# Patient Record
Sex: Female | Born: 2000 | Race: Black or African American | Hispanic: No | Marital: Single | Smoking: Never smoker
Health system: Southern US, Community
[De-identification: ages and names within clinical notes are randomized; demographics above are authoritative.]

## PROBLEM LIST (undated history)

## (undated) ENCOUNTER — Inpatient Hospital Stay (HOSPITAL_COMMUNITY): Payer: Self-pay

## (undated) DIAGNOSIS — Z789 Other specified health status: Secondary | ICD-10-CM

---
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
## 2022-09-26 ENCOUNTER — Ambulatory Visit: Admission: EM | Admit: 2022-09-26 | Discharge: 2022-09-26 | Discharge status: Absent without leave | Payer: Self-pay

## 2022-09-27 ENCOUNTER — Telehealth: Payer: Self-pay

## 2022-09-27 NOTE — Telephone Encounter (Signed)
TCT pt to f/u from recent visit. HIPAA compliant vm left for return call.

## 2023-01-10 ENCOUNTER — Other Ambulatory Visit (HOSPITAL_COMMUNITY): Payer: Self-pay

## 2023-01-10 MED ORDER — CEPHALEXIN 500 MG PO CAPS
500.0000 mg | ORAL_CAPSULE | Freq: Two times a day (BID) | ORAL | 0 refills | Status: DC
  Filled 2023-01-10: qty 10, 5d supply, fill #0

## 2023-01-13 ENCOUNTER — Other Ambulatory Visit (HOSPITAL_BASED_OUTPATIENT_CLINIC_OR_DEPARTMENT_OTHER): Payer: Self-pay

## 2023-01-13 ENCOUNTER — Other Ambulatory Visit: Payer: Self-pay

## 2023-01-13 ENCOUNTER — Encounter (HOSPITAL_BASED_OUTPATIENT_CLINIC_OR_DEPARTMENT_OTHER): Payer: Self-pay | Admitting: Emergency Medicine

## 2023-01-13 ENCOUNTER — Emergency Department (HOSPITAL_BASED_OUTPATIENT_CLINIC_OR_DEPARTMENT_OTHER)
Admission: EM | Admit: 2023-01-13 | Discharge: 2023-01-13 | Disposition: A | Discharge status: Home / Self Care | Payer: Self-pay | Attending: Emergency Medicine | Admitting: Emergency Medicine

## 2023-01-13 ENCOUNTER — Emergency Department (HOSPITAL_BASED_OUTPATIENT_CLINIC_OR_DEPARTMENT_OTHER): Payer: Self-pay

## 2023-01-13 DIAGNOSIS — N3000 Acute cystitis without hematuria: Secondary | ICD-10-CM

## 2023-01-13 DIAGNOSIS — Z3490 Encounter for supervision of normal pregnancy, unspecified, unspecified trimester: Secondary | ICD-10-CM

## 2023-01-13 DIAGNOSIS — O0001 Abdominal pregnancy with intrauterine pregnancy: Secondary | ICD-10-CM | POA: Insufficient documentation

## 2023-01-13 DIAGNOSIS — B9689 Other specified bacterial agents as the cause of diseases classified elsewhere: Secondary | ICD-10-CM | POA: Insufficient documentation

## 2023-01-13 DIAGNOSIS — O23591 Infection of other part of genital tract in pregnancy, first trimester: Secondary | ICD-10-CM | POA: Insufficient documentation

## 2023-01-13 DIAGNOSIS — O2311 Infections of bladder in pregnancy, first trimester: Secondary | ICD-10-CM | POA: Insufficient documentation

## 2023-01-13 DIAGNOSIS — Z3A01 Less than 8 weeks gestation of pregnancy: Secondary | ICD-10-CM | POA: Insufficient documentation

## 2023-01-13 LAB — BASIC METABOLIC PANEL
Anion gap: 12 (ref 5–15)
BUN: 7 mg/dL (ref 6–20)
CO2: 21 mmol/L — ABNORMAL LOW (ref 22–32)
Calcium: 9.6 mg/dL (ref 8.9–10.3)
Chloride: 101 mmol/L (ref 98–111)
Creatinine, Ser: 0.81 mg/dL (ref 0.44–1.00)
GFR, Estimated: >60 mL/min (ref >60)
Glucose, Bld: 89 mg/dL (ref 70–99)
Potassium: 3.9 mmol/L (ref 3.5–5.1)
Sodium: 134 mmol/L — ABNORMAL LOW (ref 135–145)

## 2023-01-13 LAB — CBC WITH DIFFERENTIAL/PLATELET
Abs Immature Granulocytes: 0.02 10*3/uL (ref 0.00–0.07)
Basophils Absolute: 0 10*3/uL (ref 0.0–0.1)
Basophils Relative: 1 %
Eosinophils Absolute: 0.1 10*3/uL (ref 0.0–0.5)
Eosinophils Relative: 1 %
HCT: 40.1 % (ref 36.0–46.0)
Hemoglobin: 13.6 g/dL (ref 12.0–15.0)
Immature Granulocytes: 0 %
Lymphocytes Relative: 19 %
Lymphs Abs: 1.4 10*3/uL (ref 0.7–4.0)
MCH: 26.7 pg (ref 26.0–34.0)
MCHC: 33.9 g/dL (ref 30.0–36.0)
MCV: 78.6 fL — ABNORMAL LOW (ref 80.0–100.0)
Monocytes Absolute: 0.7 10*3/uL (ref 0.1–1.0)
Monocytes Relative: 9 %
Neutro Abs: 5.3 10*3/uL (ref 1.7–7.7)
Neutrophils Relative %: 70 %
Platelets: 193 10*3/uL (ref 150–400)
RBC: 5.1 MIL/uL (ref 3.87–5.11)
RDW: 13.5 % (ref 11.5–15.5)
WBC: 7.5 10*3/uL (ref 4.0–10.5)
nRBC: 0 % (ref 0.0–0.2)

## 2023-01-13 LAB — URINALYSIS, ROUTINE W REFLEX MICROSCOPIC
Bilirubin Urine: NEGATIVE
Glucose, UA: NEGATIVE mg/dL
Ketones, ur: 40 mg/dL — AB
Nitrite: NEGATIVE
Protein, ur: 30 mg/dL — AB
RBC / HPF: >50 RBC/hpf (ref 0–5)
Specific Gravity, Urine: 1.029 (ref 1.005–1.030)
pH: 6 (ref 5.0–8.0)

## 2023-01-13 LAB — WET PREP, GENITAL
Sperm: NONE SEEN
Trich, Wet Prep: NONE SEEN
WBC, Wet Prep HPF POC: >=10 — AB (ref <10)
Yeast Wet Prep HPF POC: NONE SEEN

## 2023-01-13 LAB — HIV ANTIBODY (ROUTINE TESTING W REFLEX): HIV Screen 4th Generation wRfx: NONREACTIVE

## 2023-01-13 LAB — HCG, QUANTITATIVE, PREGNANCY: hCG, Beta Chain, Quant, S: 55399 m[IU]/mL — ABNORMAL HIGH (ref <5)

## 2023-01-13 LAB — PREGNANCY, URINE: Preg Test, Ur: POSITIVE — AB

## 2023-01-13 MED ORDER — METRONIDAZOLE 500 MG PO TABS
500.0000 mg | ORAL_TABLET | Freq: Two times a day (BID) | ORAL | 0 refills | Status: DC
  Filled 2023-01-13: qty 14, 7d supply, fill #0

## 2023-01-13 MED ORDER — NITROFURANTOIN MONOHYD MACRO 100 MG PO CAPS
100.0000 mg | ORAL_CAPSULE | Freq: Two times a day (BID) | ORAL | 0 refills | Status: DC
  Filled 2023-01-13: qty 10, 5d supply, fill #0

## 2023-01-13 MED ORDER — SODIUM CHLORIDE 0.9 % IV SOLN: INTRAVENOUS | Status: DC | PRN

## 2023-01-13 MED ORDER — PRENATAL VITAMIN 27-0.8 MG PO TABS
1.0000 | ORAL_TABLET | Freq: Every day | ORAL | 0 refills | Status: AC
  Filled 2023-01-13: qty 100, 100d supply, fill #0

## 2023-01-13 MED ORDER — SODIUM CHLORIDE 0.9 % IV BOLUS
1000.0000 mL | Freq: Once | INTRAVENOUS | Status: AC
  Administered 2023-01-13: 1000 mL via INTRAVENOUS

## 2023-01-13 MED ORDER — SODIUM CHLORIDE 0.9 % IV SOLN
2.0000 g | Freq: Once | INTRAVENOUS | Status: AC
  Administered 2023-01-13: 2 g via INTRAVENOUS
  Filled 2023-01-13: qty 20

## 2023-01-13 MED ORDER — ACETAMINOPHEN 500 MG PO TABS
1000.0000 mg | ORAL_TABLET | Freq: Once | ORAL | Status: AC
  Administered 2023-01-13: 1000 mg via ORAL
  Filled 2023-01-13: qty 2

## 2023-01-13 MED ORDER — KETOROLAC TROMETHAMINE 30 MG/ML IJ SOLN
30.0000 mg | Freq: Once | INTRAMUSCULAR | Status: DC
  Filled 2023-01-13: qty 1

## 2023-01-13 NOTE — ED Triage Notes (Signed)
Pt arrived POV, caxo4, ambulatory NAD. Pt reports she was recently tx by telehealth for UTI with Keflex which will be completed tomorrow. Pt c/o lower back pain, painful urination and reports mild relief since being on abx. Pt also states she took a home pregnancy test that was positive, however has not been confirmed further. LMP 5/8. [id number].

## 2023-01-13 NOTE — ED Provider Notes (Addendum)
[place] EMERGENCY DEPARTMENT AT [facility] Provider Note   CSN: [id number] Arrival date & time: 01/13/23  7829     History  Chief Complaint  Patient presents with   Dysuria    Hannah Griffin is a 22 y.o. female.  Pt is a 22 yo female with no significant pmhx.  Pt has had urinary sx for the past week.  She started taking keflex 500 mg bid on Tuesday, 6/11.  She said she is still having fevers and is still having dysuria.  She also said that she thinks she is pregnant.  LMP was on 5/8.  She had a + home pregnancy last night.  She does not want her BF to know.  She has had some spotting.  She does have some lower abd pain.       Home Medications Prior to Admission medications   Medication Sig Start Date End Date Taking? Authorizing Provider  metroNIDAZOLE (FLAGYL) 500 MG tablet Take 1 tablet (500 mg total) by mouth 2 (two) times daily. 01/13/23  Yes Jacalyn Lefevre, MD  nitrofurantoin, macrocrystal-monohydrate, (MACROBID) 100 MG capsule Take 1 capsule (100 mg total) by mouth 2 (two) times daily. 01/13/23  Yes Jacalyn Lefevre, MD  Prenatal Vit-Fe Fumarate-FA (PRENATAL VITAMIN) 27-0.8 MG TABS Take 1 tablet by mouth daily. 01/13/23  Yes Jacalyn Lefevre, MD  cephALEXin (KEFLEX) 500 MG capsule Take 1 capsule (500 mg total) by mouth every 12 (twelve) hours. 01/10/23     cyclobenzaprine (FLEXERIL) 10 MG tablet Take 0.5-1 tablets (5-10 mg total) by mouth 2 (two) times daily as needed. 05/26/17   Marlon Pel, PA-C  naproxen (NAPROSYN) 500 MG tablet Take 1 tablet (500 mg total) by mouth 2 (two) times daily. 05/26/17   Marlon Pel, PA-C      Allergies    Patient has no known allergies.    Review of Systems   Review of Systems  Gastrointestinal:  Positive for abdominal pain.  Genitourinary:  Positive for dysuria.  All other systems reviewed and are negative.   Physical Exam Updated Vital Signs BP (!) 118/94   Pulse 95   Temp 99.6 F (37.6 C) (Oral)   Resp 20   Ht  5\' 1"  (1.549 m)   Wt 63 kg   LMP 12/07/2022 (Exact Date)   SpO2 100%   BMI 26.26 kg/m  Physical Exam Vitals and nursing note reviewed. Exam conducted with a chaperone present.  Constitutional:      Appearance: Normal appearance.  HENT:     Head: Normocephalic and atraumatic.     Right Ear: External ear normal.     Left Ear: External ear normal.     Nose: Nose normal.     Mouth/Throat:     Mouth: Mucous membranes are dry.     Pharynx: Oropharynx is clear.  Eyes:     Extraocular Movements: Extraocular movements intact.     Conjunctiva/sclera: Conjunctivae normal.     Pupils: Pupils are equal, round, and reactive to light.  Cardiovascular:     Rate and Rhythm: Normal rate and regular rhythm.     Pulses: Normal pulses.     Heart sounds: Normal heart sounds.  Pulmonary:     Effort: Pulmonary effort is normal.     Breath sounds: Normal breath sounds.  Abdominal:     General: Abdomen is flat. Bowel sounds are normal.     Palpations: Abdomen is soft.     Tenderness: There is abdominal tenderness in the suprapubic area.  Genitourinary:    Exam position: Lithotomy position.     Vagina: Vaginal discharge present.     Cervix: Normal.     Uterus: Normal.      Adnexa:        Right: Tenderness present.        Left: Tenderness present.   Musculoskeletal:        General: Normal range of motion.     Cervical back: Normal range of motion and neck supple.  Skin:    General: Skin is warm.     Capillary Refill: Capillary refill takes less than 2 seconds.  Neurological:     General: No focal deficit present.     Mental Status: She is alert and oriented to person, place, and time.  Psychiatric:        Mood and Affect: Mood normal.        Behavior: Behavior normal.     ED Results / Procedures / Treatments   Labs (all labs ordered are listed, but only abnormal results are displayed) Labs Reviewed  WET PREP, GENITAL - Abnormal; Notable for the following components:      Result Value    Clue Cells Wet Prep HPF POC PRESENT (*)    WBC, Wet Prep HPF POC >=10 (*)    All other components within normal limits  URINALYSIS, ROUTINE W REFLEX MICROSCOPIC - Abnormal; Notable for the following components:   APPearance HAZY (*)    Hgb urine dipstick MODERATE (*)    Ketones, ur 40 (*)    Protein, ur 30 (*)    Leukocytes,Ua MODERATE (*)    Bacteria, UA RARE (*)    All other components within normal limits  PREGNANCY, URINE - Abnormal; Notable for the following components:   Preg Test, Ur POSITIVE (*)    All other components within normal limits  BASIC METABOLIC PANEL - Abnormal; Notable for the following components:   Sodium 134 (*)    CO2 21 (*)    All other components within normal limits  CBC WITH DIFFERENTIAL/PLATELET - Abnormal; Notable for the following components:   MCV 78.6 (*)    All other components within normal limits  HCG, QUANTITATIVE, PREGNANCY - Abnormal; Notable for the following components:   hCG, Beta Chain, Quant, S 55,399 (*)    All other components within normal limits  URINE CULTURE  HIV ANTIBODY (ROUTINE TESTING W REFLEX)  TYPE AND SCREEN  GC/CHLAMYDIA PROBE AMP ([employer]) NOT AT [facility]    EKG None  Radiology US RENAL  Result Date: 01/13/2023 CLINICAL DATA:  22 year old female with pain in the 1st trimester of pregnancy. Quantitative beta HCG 55,399. Estimated gestational age by LMP 5 weeks and 2 days. EXAM: RENAL / URINARY TRACT ULTRASOUND COMPLETE COMPARISON:  Ob ultrasound today reported separately. FINDINGS: Right Kidney: Renal measurements: 10.8 x 4.5 x 5.1 cm = volume: 130 mL. Preserved corticomedullary differentiation (series 2 cine images), but the right renal cortex appears echogenic when compared to the liver parenchyma. There is no right hydronephrosis or renal mass identified. Left Kidney: Renal measurements: 10.0 x 4.4 x 4.5 cm = volume: 104 mL. No left hydronephrosis or renal mass. Similar echogenicity of the left renal cortex.  Preserved corticomedullary differentiation. Bladder: Appears normal for degree of bladder distention. Other: None. IMPRESSION: 1. No hydronephrosis or acute renal finding, but increased renal cortical echogenicity raising the possibility of chronic medical renal disease. Query renal insufficiency. 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 2. Unremarkable urinary bladder. Electronically Signed  By: Odessa Fleming M.D.   On: 01/13/2023 11:24   US OB LESS THAN 14 WEEKS WITH OB TRANSVAGINAL  Result Date: 01/13/2023 CLINICAL DATA:  22 year old female with pain in the 1st trimester of pregnancy. Quantitative beta HCG 55,399. Estimated gestational age by LMP 5 weeks and 2 days. EXAM: OBSTETRIC <14 WK [place] AND TRANSVAGINAL OB US TECHNIQUE: Both transabdominal and transvaginal ultrasound examinations were performed for complete evaluation of the gestation as well as the maternal uterus, adnexal regions, and pelvic cul-de-sac. Transvaginal technique was performed to assess early pregnancy. COMPARISON:  None Available. FINDINGS: Intrauterine gestational sac: Single Yolk sac:  Visible Embryo:  Visible Cardiac Activity: Detected Heart Rate: 106 bpm MSD: 16 CRL:  2.5 mm   5 w   5 d                  [place] EDC: 09/10/2023 Subchorionic hemorrhage:  None visualized. Maternal uterus/adnexae: The left ovary is within normal limits, 2.7 x 3.6 x 2.2 cm. Small volume simple appearing free fluid in the cul-de-sac. Right ovary could not be identified. IMPRESSION: 1. Single living IUP demonstrated with estimated gestational age of [redacted] weeks and 5 days by crown-rump length. 2. Right ovary could not be identified but no acute maternal findings visualized. Electronically Signed   By: Odessa Fleming M.D.   On: 01/13/2023 11:21    Procedures Procedures    Medications Ordered in ED Medications  0.9 %  sodium chloride infusion (has no administration in time range)  acetaminophen (TYLENOL) tablet 1,000 mg (has no administration in time range)  sodium chloride  0.9 % bolus 1,000 mL (1,000 mLs Intravenous New Bag/Given 01/13/23 0931)  cefTRIAXone (ROCEPHIN) 2 g in sodium chloride 0.9 % 100 mL IVPB (2 g Intravenous New Bag/Given 01/13/23 1057)    ED Course/ Medical Decision Making/ A&P                             Medical Decision Making Amount and/or Complexity of Data Reviewed Labs: ordered. Radiology: ordered.  Risk OTC drugs. Prescription drug management.   This patient presents to the ED for concern of dysuria, this involves an extensive number of treatment options, and is a complaint that carries with it a high risk of complications and morbidity.  The differential diagnosis includes uti, vaginal infection   Co morbidities that complicate the patient evaluation  none   Additional history obtained:  Additional history obtained from epic chart review External records from outside source obtained and reviewed including sig other   Lab Tests:  I Ordered, and personally interpreted labs.  The pertinent results include:  preg +, cbc nl, ua + ketones, + protein, 21-50 wbcs and >50 rbcs; bmp nl, hcg quant 55, 399; wet prep + clue cells   Imaging Studies ordered:  I ordered imaging studies including [place]  I independently visualized and interpreted imaging which showed  Pelvic US:    Single living IUP demonstrated with estimated gestational age of  [redacted] weeks and 5 days by crown-rump length.  2. Right ovary could not be identified but no acute maternal  findings visualized.  Renal US: No hydronephrosis or acute renal finding, but increased renal  cortical echogenicity raising the possibility of chronic medical  renal disease. Query renal insufficiency. HIV nephropathy can appear  similar.  2. Unremarkable urinary bladder.   I agree with the radiologist interpretation   Cardiac Monitoring:  The patient was maintained on  a cardiac monitor.  I personally viewed and interpreted the cardiac monitored which showed an underlying rhythm  of: nsr   Medicines ordered and prescription drug management:  I ordered medication including ivfs  for sx  Reevaluation of the patient after these medicines showed that the patient improved I have reviewed the patients home medicines and have made adjustments as needed   Test Considered:  [place]   Critical Interventions:  abx   Problem List / ED Course:  IUP:  5 wks and 5 days.  Pt will be d/c with rx for pnv [facility] BV: pt will be treated with flagyl  Uti:  pt has been on keflex for a few days.  It is not the full dose as it was only rx'd for bid, but I am going to change it to macrobid in case the macrobid was not working.  Urine sent for culture.   Reevaluation:  After the interventions noted above, I reevaluated the patient and found that they have :improved   Social Determinants of Health:  No insurance/pcp   Dispostion:  After consideration of the diagnostic results and the patients response to treatment, I feel that the patent would benefit from discharge with outpatient f/u.          Final Clinical Impression(s) / ED Diagnoses Final diagnoses:  Bacterial vaginosis  Intrauterine pregnancy  Acute cystitis without hematuria    Rx / DC Orders ED Discharge Orders          Ordered    Prenatal Vit-Fe Fumarate-FA (PRENATAL VITAMIN) 27-0.8 MG TABS  Daily        01/13/23 1127    metroNIDAZOLE (FLAGYL) 500 MG tablet  2 times daily        01/13/23 1127    nitrofurantoin, macrocrystal-monohydrate, (MACROBID) 100 MG capsule  2 times daily        01/13/23 1127              Jacalyn Lefevre, MD 01/13/23 1135    Jacalyn Lefevre, MD 01/13/23 1135

## 2023-01-13 NOTE — Discharge Instructions (Addendum)
Your pregnancy is 5 weeks and 5 days.  Your due date is: 2925

## 2023-01-13 NOTE — ED Notes (Signed)
Discharge instructions, follow up care, and prescriptions reviewed and explained, pt verbalized understanding and had no further questions on d/c.  

## 2023-01-13 NOTE — ED Notes (Signed)
Patient transported to Ultrasound 

## 2023-01-14 LAB — URINE CULTURE: Culture: NO GROWTH

## 2023-01-16 LAB — GC/CHLAMYDIA PROBE AMP (~~LOC~~) NOT AT ARMC
Chlamydia: NEGATIVE
Comment: NEGATIVE
Comment: NORMAL
Neisseria Gonorrhea: NEGATIVE

## 2023-01-20 ENCOUNTER — Encounter (HOSPITAL_BASED_OUTPATIENT_CLINIC_OR_DEPARTMENT_OTHER): Payer: Self-pay | Admitting: Emergency Medicine

## 2023-01-20 ENCOUNTER — Other Ambulatory Visit: Payer: Self-pay

## 2023-01-20 DIAGNOSIS — O034 Incomplete spontaneous abortion without complication: Secondary | ICD-10-CM | POA: Insufficient documentation

## 2023-01-20 LAB — HCG, QUANTITATIVE, PREGNANCY: hCG, Beta Chain, Quant, S: 1918 m[IU]/mL — ABNORMAL HIGH (ref <5)

## 2023-01-20 NOTE — ED Triage Notes (Addendum)
Pt presents to ED Pov. Pt c/o vaginal bleeding and mild pelvic cramping today. Pt denies more than 1 pad per hour. Pt reports that she has noted spotting since taking antibiotics for UTI but today it changed in color and is more than it has been. Pt pregnant EDD 07/10/2023. LMP 5/7.

## 2023-01-21 ENCOUNTER — Emergency Department (HOSPITAL_BASED_OUTPATIENT_CLINIC_OR_DEPARTMENT_OTHER)
Admission: EM | Admit: 2023-01-21 | Discharge: 2023-01-21 | Disposition: A | Discharge status: Home / Self Care | Payer: Self-pay | Attending: Emergency Medicine | Admitting: Emergency Medicine

## 2023-01-21 DIAGNOSIS — O034 Incomplete spontaneous abortion without complication: Secondary | ICD-10-CM

## 2023-01-21 MED ORDER — IBUPROFEN 800 MG PO TABS
800.0000 mg | ORAL_TABLET | Freq: Once | ORAL | Status: AC
  Administered 2023-01-21: 800 mg via ORAL
  Filled 2023-01-21: qty 1

## 2023-01-21 NOTE — ED Provider Notes (Signed)
[place] EMERGENCY DEPARTMENT AT [facility] Provider Note   CSN: [id number] Arrival date & time: 01/20/23  2124     History  Chief Complaint  Patient presents with   Vaginal Bleeding    Hannah Griffin is a 22 y.o. female.  22 year old female who presents ER today with vaginal bleeding.  Patient was here a week ago was diagnosed with pregnancy.  Last menstrual cycle was beginning of May.  Her hCG at that time was 55,000.  She states she has some spotting or around that time but was diagnosed with a UTI and thought it might be related to that however now she has bright red blood vaginally.  Some abdominal cramping as well.  No lightheadedness, syncope, weakness or fatigue.  No other associated symptoms.   Vaginal Bleeding      Home Medications Prior to Admission medications   Medication Sig Start Date End Date Taking? Authorizing Provider  cephALEXin (KEFLEX) 500 MG capsule Take 1 capsule (500 mg total) by mouth every 12 (twelve) hours. 01/10/23     cyclobenzaprine (FLEXERIL) 10 MG tablet Take 0.5-1 tablets (5-10 mg total) by mouth 2 (two) times daily as needed. 05/26/17   Marlon Pel, PA-C  metroNIDAZOLE (FLAGYL) 500 MG tablet Take 1 tablet (500 mg total) by mouth 2 (two) times daily. 01/13/23   Jacalyn Lefevre, MD  naproxen (NAPROSYN) 500 MG tablet Take 1 tablet (500 mg total) by mouth 2 (two) times daily. 05/26/17   Marlon Pel, PA-C  nitrofurantoin, macrocrystal-monohydrate, (MACROBID) 100 MG capsule Take 1 capsule (100 mg total) by mouth 2 (two) times daily. 01/13/23   Jacalyn Lefevre, MD  Prenatal Vit-Fe Fumarate-FA (PRENATAL VITAMIN) 27-0.8 MG TABS Take 1 tablet by mouth daily. 01/13/23   Jacalyn Lefevre, MD      Allergies    Patient has no known allergies.    Review of Systems   Review of Systems  Genitourinary:  Positive for vaginal bleeding.    Physical Exam Updated Vital Signs BP 100/67   Pulse 81   Temp 98.2 F (36.8 C) (Oral)   Resp 18    LMP 12/07/2022 (Exact Date)   SpO2 100%  Physical Exam Vitals and nursing note reviewed.  Constitutional:      Appearance: She is well-developed.  HENT:     Head: Normocephalic and atraumatic.     Nose: No congestion or rhinorrhea.  Cardiovascular:     Rate and Rhythm: Normal rate and regular rhythm.  Pulmonary:     Effort: Pulmonary effort is normal. No respiratory distress.     Breath sounds: No stridor.  Abdominal:     General: There is no distension.  Genitourinary:    Comments: Pelvic exam deferred Musculoskeletal:     Cervical back: Normal range of motion.  Skin:    General: Skin is warm and dry.  Neurological:     General: No focal deficit present.     Mental Status: She is alert.     ED Results / Procedures / Treatments   Labs (all labs ordered are listed, but only abnormal results are displayed) Labs Reviewed  HCG, QUANTITATIVE, PREGNANCY - Abnormal; Notable for the following components:      Result Value   hCG, Beta Chain, Quant, S 1,918 (*)    All other components within normal limits    EKG None  Radiology No results found.  Procedures Procedures    Medications Ordered in ED Medications  ibuprofen (ADVIL) tablet 800 mg (800 mg Oral Given  01/21/23 0236)    ED Course/ Medical Decision Making/ A&P                             Medical Decision Making Amount and/or Complexity of Data Reviewed Labs: ordered. Radiology: ordered.  Risk Prescription drug management.   hCG here was 1900.  She had a ultrasound done last time she is here that showed intrauterine pregnancy however at that time her hCG was 55,000.  Her vaginal bleeding with decreasing hCG is consistent with incomplete miscarriage.  In basket message sent to her gynecologist to try to get her an earlier appointment for recheck of hCG and ultrasound. Bleeding precautions discussed with patient.    Final Clinical Impression(s) / ED Diagnoses Final diagnoses:  Incomplete miscarriage     Rx / DC Orders ED Discharge Orders     None         Temperance Kelemen, Barbara Cower, MD 01/21/23 [phone]

## 2023-01-21 NOTE — ED Notes (Signed)
Discharge instructions discussed with pt. Pt verbalized understanding. Pt stable and ambulatory.  °

## 2023-02-08 ENCOUNTER — Telehealth: Payer: Self-pay

## 2023-02-15 ENCOUNTER — Encounter: Payer: Self-pay | Admitting: Obstetrics & Gynecology

## 2023-06-22 ENCOUNTER — Emergency Department (HOSPITAL_BASED_OUTPATIENT_CLINIC_OR_DEPARTMENT_OTHER): Payer: Managed Care, Other (non HMO) | Admitting: Radiology

## 2023-06-22 ENCOUNTER — Encounter (HOSPITAL_BASED_OUTPATIENT_CLINIC_OR_DEPARTMENT_OTHER): Payer: Self-pay | Admitting: Emergency Medicine

## 2023-06-22 ENCOUNTER — Emergency Department (HOSPITAL_BASED_OUTPATIENT_CLINIC_OR_DEPARTMENT_OTHER)
Admission: EM | Admit: 2023-06-22 | Discharge: 2023-06-22 | Disposition: A | Discharge status: Home / Self Care | Payer: Managed Care, Other (non HMO) | Attending: Emergency Medicine | Admitting: Emergency Medicine

## 2023-06-22 ENCOUNTER — Other Ambulatory Visit: Payer: Self-pay

## 2023-06-22 ENCOUNTER — Other Ambulatory Visit (HOSPITAL_BASED_OUTPATIENT_CLINIC_OR_DEPARTMENT_OTHER): Payer: Self-pay

## 2023-06-22 DIAGNOSIS — X500XXA Overexertion from strenuous movement or load, initial encounter: Secondary | ICD-10-CM | POA: Insufficient documentation

## 2023-06-22 DIAGNOSIS — M79642 Pain in left hand: Secondary | ICD-10-CM | POA: Diagnosis present

## 2023-06-22 DIAGNOSIS — Y990 Civilian activity done for income or pay: Secondary | ICD-10-CM | POA: Diagnosis not present

## 2023-06-22 MED ORDER — NAPROXEN 375 MG PO TABS
375.0000 mg | ORAL_TABLET | Freq: Two times a day (BID) | ORAL | 0 refills | Status: DC
  Filled 2023-06-22: qty 30, 15d supply, fill #0
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
## 2023-06-22 NOTE — ED Notes (Signed)
Discharge paperwork given and verbally understood. 
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
## 2024-07-15 ENCOUNTER — Encounter (HOSPITAL_BASED_OUTPATIENT_CLINIC_OR_DEPARTMENT_OTHER): Payer: Self-pay | Admitting: Emergency Medicine

## 2024-07-15 ENCOUNTER — Emergency Department (HOSPITAL_BASED_OUTPATIENT_CLINIC_OR_DEPARTMENT_OTHER)
Admission: EM | Admit: 2024-07-15 | Discharge: 2024-07-16 | Disposition: A | Attending: Emergency Medicine | Admitting: Emergency Medicine

## 2024-07-15 ENCOUNTER — Emergency Department (HOSPITAL_BASED_OUTPATIENT_CLINIC_OR_DEPARTMENT_OTHER)

## 2024-07-15 ENCOUNTER — Other Ambulatory Visit: Payer: Self-pay

## 2024-07-15 DIAGNOSIS — R109 Unspecified abdominal pain: Secondary | ICD-10-CM

## 2024-07-15 DIAGNOSIS — Z3A09 9 weeks gestation of pregnancy: Secondary | ICD-10-CM | POA: Diagnosis not present

## 2024-07-15 DIAGNOSIS — R1084 Generalized abdominal pain: Secondary | ICD-10-CM | POA: Diagnosis not present

## 2024-07-15 DIAGNOSIS — O26891 Other specified pregnancy related conditions, first trimester: Secondary | ICD-10-CM | POA: Diagnosis present

## 2024-07-15 LAB — URINALYSIS, ROUTINE W REFLEX MICROSCOPIC
Bilirubin Urine: NEGATIVE
Glucose, UA: NEGATIVE mg/dL
Hgb urine dipstick: NEGATIVE
Ketones, ur: NEGATIVE mg/dL
Leukocytes,Ua: NEGATIVE
Nitrite: NEGATIVE
Protein, ur: NEGATIVE mg/dL
Specific Gravity, Urine: 1.023 (ref 1.005–1.030)
pH: 5.5 (ref 5.0–8.0)

## 2024-07-15 LAB — CBC WITH DIFFERENTIAL/PLATELET
Abs Immature Granulocytes: 0.02 K/uL (ref 0.00–0.07)
Basophils Absolute: 0 K/uL (ref 0.0–0.1)
Basophils Relative: 0 %
Eosinophils Absolute: 0.1 K/uL (ref 0.0–0.5)
Eosinophils Relative: 1 %
HCT: 39.8 % (ref 36.0–46.0)
Hemoglobin: 13.2 g/dL (ref 12.0–15.0)
Immature Granulocytes: 0 %
Lymphocytes Relative: 23 %
Lymphs Abs: 2.5 K/uL (ref 0.7–4.0)
MCH: 26.5 pg (ref 26.0–34.0)
MCHC: 33.2 g/dL (ref 30.0–36.0)
MCV: 79.9 fL — ABNORMAL LOW (ref 80.0–100.0)
Monocytes Absolute: 0.8 K/uL (ref 0.1–1.0)
Monocytes Relative: 7 %
Neutro Abs: 7.4 K/uL (ref 1.7–7.7)
Neutrophils Relative %: 69 %
Platelets: 158 K/uL (ref 150–400)
RBC: 4.98 MIL/uL (ref 3.87–5.11)
RDW: 13.7 % (ref 11.5–15.5)
WBC: 10.9 K/uL — ABNORMAL HIGH (ref 4.0–10.5)
nRBC: 0 % (ref 0.0–0.2)

## 2024-07-15 LAB — WET PREP, GENITAL
Clue Cells Wet Prep HPF POC: NONE SEEN
Sperm: NONE SEEN
Trich, Wet Prep: NONE SEEN
WBC, Wet Prep HPF POC: >=10 — AB (ref <10)
Yeast Wet Prep HPF POC: NONE SEEN

## 2024-07-15 LAB — COMPREHENSIVE METABOLIC PANEL WITH GFR
ALT: 20 U/L (ref 0–44)
AST: 20 U/L (ref 15–41)
Albumin: 4.3 g/dL (ref 3.5–5.0)
Alkaline Phosphatase: 63 U/L (ref 38–126)
Anion gap: 15 (ref 5–15)
BUN: 10 mg/dL (ref 6–20)
CO2: 17 mmol/L — ABNORMAL LOW (ref 22–32)
Calcium: 9.8 mg/dL (ref 8.9–10.3)
Chloride: 103 mmol/L (ref 98–111)
Creatinine, Ser: 0.65 mg/dL (ref 0.44–1.00)
GFR, Estimated: >60 mL/min (ref >60)
Glucose, Bld: 76 mg/dL (ref 70–99)
Potassium: 3.7 mmol/L (ref 3.5–5.1)
Sodium: 135 mmol/L (ref 135–145)
Total Bilirubin: 0.3 mg/dL (ref 0.0–1.2)
Total Protein: 7.6 g/dL (ref 6.5–8.1)

## 2024-07-15 LAB — ABO/RH: ABO/RH(D): B POS

## 2024-07-15 LAB — HCG, QUANTITATIVE, PREGNANCY: hCG, Beta Chain, Quant, S: 117899 m[IU]/mL — ABNORMAL HIGH (ref <5)

## 2024-07-15 NOTE — ED Notes (Signed)
 ED Provider at bedside.

## 2024-07-15 NOTE — Discharge Instructions (Addendum)
 You were seen here today for your abdominal cramping.  Your workup is reassuring.  Your kidney, liver, and gallbladder labs are normal.  We do not see any signs of infection on your urine.  The swabs today are negative for bacterial vaginosis and yeast infection.  The ultrasound shows that you are estimated to be at 9 weeks and 3 days along.  Your due date is estimated to be February 14, 2025.  Baby has a normal heart rate and is in the correct location (intrauterine).  It seems that you also have a fibroid in your uterus noted on your ultrasound.  There is nothing to be done about this finding, but this can cause some abdominal cramping or heavy menstrual periods.  You may take up to 1000mg  of tylenol  every 6 hours as needed for pain.  Do not take more then 4g per day.  Do not take any NSAID medications such as ibuprofen , Aleve , naproxen , BC powders during pregnancy as this can harm the baby's development.  There are many options for quick evaluation and management of certain women's health issues that do not require a long wait time or big bill from the emergency department.   If you experience vaginal bleeding, begin passing clots, or have any other pregnancy related concerns, please go directly to: [facility] at [facility] [address] [place], [place], [place] [address]  You do not need an appointment.   Please establish care with a OBGYN. You can make an appointment to see a provider:   Center for [facility] at [facility]  [address]  [phone]   Center for [facility] at [facility]  [address] [employer]  [phone]   Center for [facility] at [facility] [address]  [phone]   Center for [facility] at [facility]  [address]  [phone]   Center for [employer] [facility]  [address] [place]  [phone]   If you already have an  established OB/GYN provider in the area, please make an appointment with them.

## 2024-07-15 NOTE — ED Triage Notes (Signed)
+   pregnancy About 9 weeks Cramping x couple days Comes and goes Denies bleeding

## 2024-07-15 NOTE — ED Provider Notes (Signed)
 [place] EMERGENCY DEPARTMENT AT [facility] Provider Note   CSN: [id number] Arrival date & time: 07/15/24  2012     Patient presents with: Abdominal Cramping   Hannah Griffin  is a 23 y.o. female with no significant past medical history presents with concern for lower abdominal cramping that started this morning.  She states this feels somewhat like a period cramp.  She reports she is about [redacted] weeks pregnant.  Denies any abnormal vaginal discharge or vaginal bleeding.  Denies any fever or chills.  No nausea or vomiting.  Denies any dysuria, hematuria, increased frequency.  She has not established with a OB/GYN yet.    Abdominal Cramping Associated symptoms include abdominal pain.       Prior to Admission medications  Medication Sig Start Date End Date Taking? Authorizing Provider  cephALEXin  (KEFLEX ) 500 MG capsule Take 1 capsule (500 mg total) by mouth every 12 (twelve) hours. 01/10/23     cyclobenzaprine  (FLEXERIL ) 10 MG tablet Take 0.5-1 tablets (5-10 mg total) by mouth 2 (two) times daily as needed. 05/26/17   Levora Riggs, PA-C  metroNIDAZOLE  (FLAGYL ) 500 MG tablet Take 1 tablet (500 mg total) by mouth 2 (two) times daily. 01/13/23   Dean Clarity, MD  naproxen  (NAPROSYN ) 375 MG tablet Take 1 tablet (375 mg total) by mouth 2 (two) times daily. 06/22/23   Alva Larraine FALCON, PA-C  nitrofurantoin , macrocrystal-monohydrate, (MACROBID ) 100 MG capsule Take 1 capsule (100 mg total) by mouth 2 (two) times daily. 01/13/23   Dean Clarity, MD  Prenatal Vit-Fe Fumarate-FA (PRENATAL VITAMIN) 27-0.8 MG TABS Take 1 tablet by mouth daily. 01/13/23   Dean Clarity, MD    Allergies: Patient has no known allergies.    Review of Systems  Gastrointestinal:  Positive for abdominal pain.    Updated Vital Signs BP 117/83 (BP Location: Right Arm)   Pulse 78   Temp 98 F (36.7 C)   Resp 20   SpO2 100%   Physical Exam Vitals and nursing note reviewed. Exam conducted with a  chaperone present.  Constitutional:      General: She is not in acute distress.    Appearance: She is well-developed.  HENT:     Head: Normocephalic and atraumatic.  Eyes:     Conjunctiva/sclera: Conjunctivae normal.  Cardiovascular:     Rate and Rhythm: Normal rate and regular rhythm.     Heart sounds: No murmur heard. Pulmonary:     Effort: Pulmonary effort is normal. No respiratory distress.     Breath sounds: Normal breath sounds.  Abdominal:     Palpations: Abdomen is soft.     Tenderness: There is no abdominal tenderness.  Genitourinary:    Comments: RN Montine Blush present to chaperone pelvic exam  Patient with moderate amount of white appearing vaginal discharge.  No vaginal bleeding.  Cervix closed. Musculoskeletal:        General: No swelling.     Cervical back: Neck supple.  Skin:    General: Skin is warm and dry.     Capillary Refill: Capillary refill takes less than 2 seconds.  Neurological:     Mental Status: She is alert.  Psychiatric:        Mood and Affect: Mood normal.     (all labs ordered are listed, but only abnormal results are displayed) Labs Reviewed  WET PREP, GENITAL - Abnormal; Notable for the following components:      Result Value   WBC, Wet Prep HPF POC >=10 (*)  All other components within normal limits  CBC WITH DIFFERENTIAL/PLATELET - Abnormal; Notable for the following components:   WBC 10.9 (*)    MCV 79.9 (*)    All other components within normal limits  COMPREHENSIVE METABOLIC PANEL WITH GFR - Abnormal; Notable for the following components:   CO2 17 (*)    All other components within normal limits  HCG, QUANTITATIVE, PREGNANCY - Abnormal; Notable for the following components:   hCG, Beta Chain, Quant, S 882,100 (*)    All other components within normal limits  URINALYSIS, ROUTINE W REFLEX MICROSCOPIC  ABO/RH  GC/CHLAMYDIA PROBE AMP ([place]) NOT AT [facility]    EKG: None  Radiology: US  OB LESS THAN 14 WEEKS WITH OB  TRANSVAGINAL Result Date: 07/15/2024 EXAM: ULTRASOUND FIRST TRIMESTER TECHNIQUE: Transabdominal and transvaginal first trimester obstetric pelvic duplex ultrasound was performed with real-time imaging and color flow Doppler imaging. COMPARISON: None available. CLINICAL HISTORY: Abdominal cramping. FINDINGS: UTERUS: Mild heterogeneity in the anterior aspect of the mid uterus is noted which may represent a uterine fibroid. GESTATIONAL SAC(S): A single intrauterine gestational sac is noted. No subchorionic hemorrhage. YOLK SAC: The yolk sac is seen. EMBRYO(<11WK) /FETUS(>=11WK): The embryo is seen. CROWN RUMP LENGTH: Crown-rump length measures 26.7 mm. RATE OF CARDIAC ACTIVITY: Cardiac activity is noted. The cardiac rate is 171 bpm. RIGHT OVARY: The right ovary is not well visualized due to overlying bowel gas. LEFT OVARY: The left ovary appears within normal limits. Normal arterial and venous flow. FREE FLUID: No free fluid. MEASUREMENTS ESTIMATED GESTATIONAL AGE BY CURRENT ULTRASOUND: 9 weeks 3 days. ESTIMATED GESTATIONAL AGE BY LMP/PRIOR ULTRASOUND: 9 weeks 3 days. ESTIMATED DUE DATE: 02/14/2025 IMPRESSION: 1. Intrauterine pregnancy with viable embryo and cardiac activity at 171 bpm. 2. Estimated gestational age by ultrasound is 9 weeks 3 days, concordant with dates. 3. Mild heterogeneity in the anterior mid uterus, possibly a uterine fibroid. Electronically signed by: Oneil Devonshire MD 07/15/2024 09:53 PM EST RP Workstation: [id number]     Procedures   Medications Ordered in the ED - No data to display                                  Medical Decision Making Amount and/or Complexity of Data Reviewed Labs: ordered. Radiology: ordered.     Differential diagnosis includes but is not limited to  inevitable abortion, incomplete abortion, ectopic pregnancy, UTI, pyelonephritis, appendicitis  ED Course:  Upon initial evaluation, patient is well-appearing, no acute distress.  Normal vital signs.   Abdomen is soft and nontender to palpation.  Patient declines any pain medicine at this time.  Pelvic exam was performed with RN chaperone present.  Patient with mild amount of white discharge in the vaginal vault.  No vaginal bleeding.  Cervix closed.  Labs Ordered: I Ordered, and personally interpreted labs.  The pertinent results include:   CBC with leukocytosis of 10.9 CMP without abnormality Urinalysis without signs of infection Wet prep negative for yeast, trichomoniasis, or BV Beta-hCG elevated at 117,899  Imaging Studies ordered: I ordered imaging studies including transvaginal ultrasound I independently visualized the imaging with scope of interpretation limited to determining acute life threatening conditions related to emergency care. Imaging showed  IMPRESSION:  1. Intrauterine pregnancy with viable embryo and cardiac activity at 171 bpm.  2. Estimated gestational age by ultrasound is 9 weeks 3 days, concordant with  dates.  3. Mild heterogeneity in the anterior mid uterus,  possibly a uterine fibroid.   I agree with the radiologist interpretation   Medications Given: None  Upon re-evaluation, patient patient remains well-appearing her workup is reassuring.  Patient's transvaginal ultrasound shows an intrauterine pregnancy estimated to be at 9 weeks and 3 days.  This is also consistent with patient's beta-hCG.  Patient's urinalysis without any signs of infection, no concern for acute cystitis or pyelonephritis at this time.  Wet prep negative for yeast, trichomoniasis, BV.  Patient denies any concern for STIs, no indication for prophylactic treatment.  CBC with mild leukocytosis at 10.9, but patient without any fever, tachycardia, or significant abdominal pain to suggest acute intra-abdominal infection or process such as appendicitis, diverticulitis.  CMP and lipase within normal limits. Suspect patient's abdominal cramping is likely pregnancy related.  No signs of emergent  pathology at this time.  Patient is stable and appropriate for discharge home at this time    Impression: Abdominal cramping Pregnancy of 9 weeks and 3 days  Disposition:  The patient was discharged home with instructions to establish care with a OB/GYN for further pregnancy related care.  Several offices were provided for patient, and she understands that she needs to call an office of her choice to establish care.  May take Tylenol  as needed for pain.  She understands to avoid NSAID medications.  We discussed continuing on a daily multivitamin. Return precautions given and patient verbalized understanding.    This chart was dictated using voice recognition software, Dragon. Despite the best efforts of this provider to proofread and correct errors, errors may still occur which can change documentation meaning.       Final diagnoses:  Abdominal cramping  [redacted] weeks gestation of pregnancy    ED Discharge Orders     None          Hannah Griffin, Hannah Griffin 07/16/24 0009    Hannah Lamar BROCKS, MD 07/16/24 1539

## 2024-07-16 NOTE — ED Provider Notes (Incomplete)
 [place] EMERGENCY DEPARTMENT AT [facility] Provider Note   CSN: [id number] Arrival date & time: 07/15/24  2012     Patient presents with: Abdominal Cramping   Hannah Griffin  is a 23 y.o. female with no significant past medical history presents with concern for lower abdominal cramping that started this morning.  She states this feels somewhat like a period cramp.  She reports she is about [redacted] weeks pregnant.  Denies any abnormal vaginal discharge or vaginal bleeding.  Denies any fever or chills.  No nausea or vomiting.  Denies any dysuria, hematuria, increased frequency.  She has not established with a OB/GYN yet.  {Add pertinent medical, surgical, social history, OB history to HPI:32947}  Abdominal Cramping Associated symptoms include abdominal pain.       Prior to Admission medications  Medication Sig Start Date End Date Taking? Authorizing Provider  cephALEXin  (KEFLEX ) 500 MG capsule Take 1 capsule (500 mg total) by mouth every 12 (twelve) hours. 01/10/23     cyclobenzaprine  (FLEXERIL ) 10 MG tablet Take 0.5-1 tablets (5-10 mg total) by mouth 2 (two) times daily as needed. 05/26/17   Levora Riggs, PA-C  metroNIDAZOLE  (FLAGYL ) 500 MG tablet Take 1 tablet (500 mg total) by mouth 2 (two) times daily. 01/13/23   Dean Clarity, MD  naproxen  (NAPROSYN ) 375 MG tablet Take 1 tablet (375 mg total) by mouth 2 (two) times daily. 06/22/23   Alva Larraine FALCON, PA-C  nitrofurantoin , macrocrystal-monohydrate, (MACROBID ) 100 MG capsule Take 1 capsule (100 mg total) by mouth 2 (two) times daily. 01/13/23   Dean Clarity, MD  Prenatal Vit-Fe Fumarate-FA (PRENATAL VITAMIN) 27-0.8 MG TABS Take 1 tablet by mouth daily. 01/13/23   Dean Clarity, MD    Allergies: Patient has no known allergies.    Review of Systems  Gastrointestinal:  Positive for abdominal pain.    Updated Vital Signs BP 138/85 (BP Location: Right Arm)   Pulse 75   Temp 98 F (36.7 C)   Resp 19   SpO2 100%    Physical Exam Vitals and nursing note reviewed. Exam conducted with a chaperone present.  Constitutional:      General: She is not in acute distress.    Appearance: She is well-developed.  HENT:     Head: Normocephalic and atraumatic.  Eyes:     Conjunctiva/sclera: Conjunctivae normal.  Cardiovascular:     Rate and Rhythm: Normal rate and regular rhythm.     Heart sounds: No murmur heard. Pulmonary:     Effort: Pulmonary effort is normal. No respiratory distress.     Breath sounds: Normal breath sounds.  Abdominal:     Palpations: Abdomen is soft.     Tenderness: There is no abdominal tenderness.  Genitourinary:    Comments: RN Montine Blush present to chaperone pelvic exam  Patient with moderate amount of white appearing vaginal discharge.  No vaginal bleeding.  Cervix closed. Musculoskeletal:        General: No swelling.     Cervical back: Neck supple.  Skin:    General: Skin is warm and dry.     Capillary Refill: Capillary refill takes less than 2 seconds.  Neurological:     Mental Status: She is alert.  Psychiatric:        Mood and Affect: Mood normal.     (all labs ordered are listed, but only abnormal results are displayed) Labs Reviewed  WET PREP, GENITAL - Abnormal; Notable for the following components:      Result Value  WBC, Wet Prep HPF POC >=10 (*)    All other components within normal limits  CBC WITH DIFFERENTIAL/PLATELET - Abnormal; Notable for the following components:   WBC 10.9 (*)    MCV 79.9 (*)    All other components within normal limits  COMPREHENSIVE METABOLIC PANEL WITH GFR - Abnormal; Notable for the following components:   CO2 17 (*)    All other components within normal limits  HCG, QUANTITATIVE, PREGNANCY - Abnormal; Notable for the following components:   hCG, Beta Chain, Quant, S 882,100 (*)    All other components within normal limits  URINALYSIS, ROUTINE W REFLEX MICROSCOPIC  ABO/RH  GC/CHLAMYDIA PROBE AMP ([place]) NOT  AT [facility]    EKG: None  Radiology: US  OB LESS THAN 14 WEEKS WITH OB TRANSVAGINAL Result Date: 07/15/2024 EXAM: ULTRASOUND FIRST TRIMESTER TECHNIQUE: Transabdominal and transvaginal first trimester obstetric pelvic duplex ultrasound was performed with real-time imaging and color flow Doppler imaging. COMPARISON: None available. CLINICAL HISTORY: Abdominal cramping. FINDINGS: UTERUS: Mild heterogeneity in the anterior aspect of the mid uterus is noted which may represent a uterine fibroid. GESTATIONAL SAC(S): A single intrauterine gestational sac is noted. No subchorionic hemorrhage. YOLK SAC: The yolk sac is seen. EMBRYO(<11WK) /FETUS(>=11WK): The embryo is seen. CROWN RUMP LENGTH: Crown-rump length measures 26.7 mm. RATE OF CARDIAC ACTIVITY: Cardiac activity is noted. The cardiac rate is 171 bpm. RIGHT OVARY: The right ovary is not well visualized due to overlying bowel gas. LEFT OVARY: The left ovary appears within normal limits. Normal arterial and venous flow. FREE FLUID: No free fluid. MEASUREMENTS ESTIMATED GESTATIONAL AGE BY CURRENT ULTRASOUND: 9 weeks 3 days. ESTIMATED GESTATIONAL AGE BY LMP/PRIOR ULTRASOUND: 9 weeks 3 days. ESTIMATED DUE DATE: 02/14/2025 IMPRESSION: 1. Intrauterine pregnancy with viable embryo and cardiac activity at 171 bpm. 2. Estimated gestational age by ultrasound is 9 weeks 3 days, concordant with dates. 3. Mild heterogeneity in the anterior mid uterus, possibly a uterine fibroid. Electronically signed by: Oneil Devonshire MD 07/15/2024 09:53 PM EST RP Workstation: [id number]    {Document cardiac monitor, telemetry assessment procedure when appropriate:32947} Procedures   Medications Ordered in the ED - No data to display    {Click here for ABCD2, HEART and other calculators REFRESH Note before signing:1}                              Medical Decision Making Amount and/or Complexity of Data Reviewed Labs: ordered. Radiology: ordered.     Differential diagnosis  includes but is not limited to  inevitable abortion, incomplete abortion, ectopic pregnancy, UTI, pyelonephritis, appendicitis  ED Course:  Upon initial evaluation, patient is   Labs Ordered: I Ordered, and personally interpreted labs.  The pertinent results include:  ***  Imaging Studies ordered: I ordered imaging studies including ***  I independently visualized the imaging with scope of interpretation limited to determining acute life threatening conditions related to emergency care. Imaging showed *** I agree with the radiologist interpretation   Cardiac Monitoring: / EKG: The patient was maintained on a cardiac monitor.  I personally viewed and interpreted the cardiac monitored which showed an underlying rhythm of: ***   Consultations Obtained: I requested consultation with the ***,  and discussed lab and imaging findings as well as pertinent plan - they recommend: ***  Medications Given: ***  Upon re-evaluation, patient ***.      Impression: ***  Disposition:  {AF ED Dispo:29713} Return precautions given and  patient verbalized understanding.    Record Review: External records from outside source obtained and reviewed including ***     This chart was dictated using voice recognition software, Dragon. Despite the best efforts of this provider to proofread and correct errors, errors may still occur which can change documentation meaning.    {Document critical care time when appropriate  Document review of labs and clinical decision tools ie CHADS2VASC2, etc  Document your independent review of radiology images and any outside records  Document your discussion with family members, caretakers and with consultants  Document social determinants of health affecting pt's care  Document your decision making why or why not admission, treatments were needed:32947:::1}   Final diagnoses:  None    ED Discharge Orders     None

## 2024-07-17 LAB — GC/CHLAMYDIA PROBE AMP (~~LOC~~) NOT AT ARMC
Chlamydia: NEGATIVE
Comment: NEGATIVE
Comment: NORMAL
Neisseria Gonorrhea: NEGATIVE

## 2024-07-23 ENCOUNTER — Encounter: Payer: Self-pay | Admitting: Advanced Practice Midwife

## 2024-07-23 ENCOUNTER — Other Ambulatory Visit (HOSPITAL_BASED_OUTPATIENT_CLINIC_OR_DEPARTMENT_OTHER): Payer: Self-pay

## 2024-07-23 ENCOUNTER — Ambulatory Visit: Admitting: Advanced Practice Midwife

## 2024-07-23 ENCOUNTER — Other Ambulatory Visit: Payer: Self-pay

## 2024-07-23 VITALS — BP 122/70 | HR 74 | Wt 150.0 lb

## 2024-07-23 DIAGNOSIS — Z3A10 10 weeks gestation of pregnancy: Secondary | ICD-10-CM | POA: Diagnosis not present

## 2024-07-23 DIAGNOSIS — Z3480 Encounter for supervision of other normal pregnancy, unspecified trimester: Secondary | ICD-10-CM | POA: Insufficient documentation

## 2024-07-23 DIAGNOSIS — Z3481 Encounter for supervision of other normal pregnancy, first trimester: Secondary | ICD-10-CM

## 2024-07-23 DIAGNOSIS — Z131 Encounter for screening for diabetes mellitus: Secondary | ICD-10-CM

## 2024-07-23 MED ORDER — ASPIRIN 81 MG PO TBEC
81.0000 mg | DELAYED_RELEASE_TABLET | Freq: Every day | ORAL | 5 refills | Status: AC
  Filled 2024-07-23: qty 30, 30d supply, fill #0

## 2024-07-23 NOTE — Progress Notes (Signed)
 Ultrasound image given to patient during initial prenatal appointment.  Silvano LELON Piano, RN

## 2024-07-23 NOTE — Progress Notes (Signed)
 "    Subjective:   Hannah Griffin  is a 23 y.o. G2P0010 at [redacted]w[redacted]d by LMP being seen today for her first obstetrical visit.  Her obstetrical history is significant for SAB x 1 and has Supervision of other normal pregnancy, antepartum on their problem list.. Patient does intend to breast feed. Pregnancy history fully reviewed.  Patient reports low appetite, improving recently.  HISTORY: OB History  Gravida Para Term Preterm AB Living  2 0 0 0 1 0  SAB IAB Ectopic Multiple Live Births  1 0 0 0 0    # Outcome Date GA Lbr Len/2nd Weight Sex Type Anes PTL Lv  2 Current           1 SAB 12/2022           History reviewed. No pertinent past medical history. History reviewed. No pertinent surgical history. Family History  Problem Relation Age of Onset   Cancer Paternal Grandmother    Diabetes Paternal Grandfather    Cancer Maternal Grandparent    Social History[1] Allergies[2] Medications Ordered Prior to Encounter[3]   Exam   Vitals:   07/23/24 1332  BP: 122/70  Pulse: 74  Weight: 150 lb (68 kg)   Fetal Heart Rate (bpm):  (unable to doppler, provider notified)  Uterus:     Pelvic Exam: Perineum: no hemorrhoids, normal perineum   Vulva: normal external genitalia, no lesions   Vagina:  normal mucosa, normal discharge   Cervix: no lesions and normal, pap smear done.    Adnexa: normal adnexa and no mass, fullness, tenderness   Bony Pelvis: average  System: General: well-developed, well-nourished female in no acute distress   Breast:  normal appearance, no masses or tenderness   Skin: normal coloration and turgor, no rashes   Neurologic: oriented, normal, negative, normal mood   Extremities: normal strength, tone, and muscle mass, ROM of all joints is normal   HEENT PERRLA, extraocular movement intact and sclera clear, anicteric   Mouth/Teeth mucous membranes moist, pharynx normal without lesions and dental hygiene good   Neck supple and no masses   Cardiovascular: regular  rate and rhythm   Respiratory:  no respiratory distress, normal breath sounds   Abdomen: soft, non-tender; bowel sounds normal; no masses,  no organomegaly     Assessment:   Pregnancy: G2P0010 Patient Active Problem List   Diagnosis Date Noted   Supervision of other normal pregnancy, antepartum 07/23/2024     Plan:  1. Supervision of other normal pregnancy, antepartum (Primary) --Anticipatory guidance about next visits/weeks of pregnancy given.   --Unable to obtain FHT with dopper so bedside US  performed (see images in media tab).  FHR 160s noted with fetal movement, normal appearing amniotic fluid.  Pt reassured.  Formal US  ordered.    --BASA for AA, G1 - CBC/D/Plt+RPR+Rh+ABO+RubIgG... - Culture, OB Urine - Hemoglobin A1c - PANORAMA PRENATAL TEST - US  MFM OB COMP + 14 WK; Future - US  OB Limited; Future  2. [redacted] weeks gestation of pregnancy  - CBC/D/Plt+RPR+Rh+ABO+RubIgG... - Culture, OB Urine - Hemoglobin A1c - PANORAMA PRENATAL TEST - US  MFM OB COMP + 14 WK; Future  3. Screening for diabetes mellitus  - Hemoglobin A1c   Initial labs drawn. Continue prenatal vitamins. Discussed and offered genetic screening options, including Quad screen/AFP, NIPS testing, and option to decline testing. Benefits/risks/alternatives reviewed. Pt aware that anatomy US  is form of genetic screening with lower accuracy in detecting trisomies than blood work.  Pt chooses genetic screening today. NIPS:  ordered. Ultrasound discussed; fetal anatomic survey: ordered. Problem list reviewed and updated. The nature of [facility] with multiple MDs and other Advanced Practice Providers was explained to patient; also emphasized that residents, students are part of our team. Routine obstetric precautions reviewed. No follow-ups on file.   Olam Boards, CNM 07/23/2024 5:17 PM     [1]  Social History Tobacco Use   Smoking status: Never   Smokeless  tobacco: Never  Vaping Use   Vaping status: Never Used  Substance Use Topics   Alcohol use: No   Drug use: No  [2] No Known Allergies [3]  Current Outpatient Medications on File Prior to Visit  Medication Sig Dispense Refill   Prenatal Vit-Fe Fumarate-FA (PRENATAL VITAMIN) 27-0.8 MG TABS Take 1 tablet by mouth daily. 100 tablet 0   cephALEXin  (KEFLEX ) 500 MG capsule Take 1 capsule (500 mg total) by mouth every 12 (twelve) hours. (Patient not taking: Reported on 07/23/2024) 10 capsule 0   cyclobenzaprine  (FLEXERIL ) 10 MG tablet Take 0.5-1 tablets (5-10 mg total) by mouth 2 (two) times daily as needed. (Patient not taking: Reported on 07/23/2024) 20 tablet 0   metroNIDAZOLE  (FLAGYL ) 500 MG tablet Take 1 tablet (500 mg total) by mouth 2 (two) times daily. (Patient not taking: Reported on 07/23/2024) 14 tablet 0   naproxen  (NAPROSYN ) 375 MG tablet Take 1 tablet (375 mg total) by mouth 2 (two) times daily. (Patient not taking: Reported on 07/23/2024) 30 tablet 0   nitrofurantoin , macrocrystal-monohydrate, (MACROBID ) 100 MG capsule Take 1 capsule (100 mg total) by mouth 2 (two) times daily. (Patient not taking: Reported on 07/23/2024) 10 capsule 0   No current facility-administered medications on file prior to visit.   "

## 2024-07-24 LAB — CBC/D/PLT+RPR+RH+ABO+RUBIGG...
Antibody Screen: NEGATIVE
Basophils Absolute: 0 x10E3/uL (ref 0.0–0.2)
Basos: 0 %
EOS (ABSOLUTE): 0.1 x10E3/uL (ref 0.0–0.4)
Eos: 1 %
HCV Ab: NONREACTIVE
HIV Screen 4th Generation wRfx: NONREACTIVE
Hematocrit: 40 % (ref 34.0–46.6)
Hemoglobin: 12.8 g/dL (ref 11.1–15.9)
Hepatitis B Surface Ag: NEGATIVE
Immature Grans (Abs): 0 x10E3/uL (ref 0.0–0.1)
Immature Granulocytes: 0 %
Lymphocytes Absolute: 2.2 x10E3/uL (ref 0.7–3.1)
Lymphs: 20 %
MCH: 26.6 pg (ref 26.6–33.0)
MCHC: 32 g/dL (ref 31.5–35.7)
MCV: 83 fL (ref 79–97)
Monocytes Absolute: 0.9 x10E3/uL (ref 0.1–0.9)
Monocytes: 8 %
Neutrophils Absolute: 8 x10E3/uL — ABNORMAL HIGH (ref 1.4–7.0)
Neutrophils: 71 %
Platelets: 237 x10E3/uL (ref 150–450)
RBC: 4.82 x10E6/uL (ref 3.77–5.28)
RDW: 13.3 % (ref 11.7–15.4)
RPR Ser Ql: NONREACTIVE
Rh Factor: POSITIVE
Rubella Antibodies, IGG: 2.49 {index}
WBC: 11.2 x10E3/uL — ABNORMAL HIGH (ref 3.4–10.8)

## 2024-07-24 LAB — HEMOGLOBIN A1C
Est. average glucose Bld gHb Est-mCnc: 105 mg/dL
Hgb A1c MFr Bld: 5.3 % (ref 4.8–5.6)

## 2024-07-24 LAB — HCV INTERPRETATION

## 2024-07-25 LAB — URINE CULTURE, OB REFLEX

## 2024-07-25 LAB — CULTURE, OB URINE

## 2024-08-04 LAB — PANORAMA PRENATAL TEST FULL PANEL:PANORAMA TEST PLUS 5 ADDITIONAL MICRODELETIONS: FETAL FRACTION: 6.1

## 2024-08-21 ENCOUNTER — Encounter: Payer: Self-pay | Admitting: Obstetrics & Gynecology

## 2024-08-21 ENCOUNTER — Ambulatory Visit: Admitting: Obstetrics & Gynecology

## 2024-08-21 VITALS — BP 127/79 | HR 90 | Wt 162.0 lb

## 2024-08-21 DIAGNOSIS — Z3480 Encounter for supervision of other normal pregnancy, unspecified trimester: Secondary | ICD-10-CM

## 2024-08-21 DIAGNOSIS — Z3A14 14 weeks gestation of pregnancy: Secondary | ICD-10-CM

## 2024-08-21 NOTE — Progress Notes (Signed)
" ° °  PRENATAL VISIT NOTE  Subjective:  Hannah Griffin  is a 24 y.o. G2P0010 at [redacted]w[redacted]d being seen today for ongoing prenatal care.  She is currently monitored for the following issues for this low-risk pregnancy and has Supervision of other normal pregnancy, antepartum on their problem list.  Patient reports no complaints.  Contractions: Not present. Vag. Bleeding: None.  Movement: Absent. Denies leaking of fluid.   The following portions of the patient's history were reviewed and updated as appropriate: allergies, current medications, past family history, past medical history, past social history, past surgical history and problem list.   Objective:   Vitals:   08/21/24 1334  BP: 127/79  Pulse: 90  Weight: 162 lb (73.5 kg)    Fetal Status:  Fetal Heart Rate (bpm): 149   Movement: Absent    General: Alert, oriented and cooperative. Patient is in no acute distress.  Skin: Skin is warm and dry. No rash noted.   Cardiovascular: Normal heart rate noted  Respiratory: Normal respiratory effort, no problems with respiration noted  Abdomen: Soft, gravid, appropriate for gestational age.  Pain/Pressure: Absent (cramping comes and goes)     Pelvic: Cervical exam deferred        Extremities: Normal range of motion.  Edema: None  Mental Status: Normal mood and affect. Normal behavior. Normal judgment and thought content.      07/23/2024    1:48 PM  Depression screen PHQ 2/9  Decreased Interest 0  Down, Depressed, Hopeless 0  PHQ - 2 Score 0  Altered sleeping 1  Tired, decreased energy 2  Change in appetite 2  Feeling bad or failure about yourself  0  Trouble concentrating 0  Moving slowly or fidgety/restless 0  Suicidal thoughts 0  PHQ-9 Score 5        07/23/2024    1:47 PM  GAD 7 : Generalized Anxiety Score  Nervous, Anxious, on Edge 2   Control/stop worrying 0   Worry too much - different things 2   Trouble relaxing 1   Restless 0   Easily annoyed or irritable 0   Afraid -  awful might happen 0   Total GAD 7 Score 5     Data saved with a previous flowsheet row definition    Assessment and Plan:  Pregnancy: G2P0010 at [redacted]w[redacted]d 1. [redacted] weeks gestation of pregnancy 2. Supervision of other normal pregnancy, antepartum (Primary) LR NIPS, anatomy scan to be scheduled. Second trimester expectations reviewed and all questions answered. No other complaints or concerns.  Routine obstetric precautions reviewed.  Please refer to After Visit Summary for other counseling recommendations.   Return in about 4 weeks (around 09/18/2024) for OFFICE OB VISIT (MD or APP).  Future Appointments  Date Time Provider Department Center  09/18/2024  1:30 PM Shine Scrogham A, MD [facility]  09/30/2024  3:45 PM [facility]  [facility] None    Gloris Hugger, MD  "

## 2024-09-05 ENCOUNTER — Other Ambulatory Visit: Payer: Self-pay

## 2024-09-05 ENCOUNTER — Inpatient Hospital Stay (HOSPITAL_BASED_OUTPATIENT_CLINIC_OR_DEPARTMENT_OTHER)
Admission: EM | Admit: 2024-09-05 | Discharge: 2024-09-05 | Disposition: A | Discharge status: Home / Self Care | Source: Home / Self Care | Attending: Emergency Medicine | Admitting: Emergency Medicine

## 2024-09-05 ENCOUNTER — Encounter (HOSPITAL_COMMUNITY): Payer: Self-pay | Admitting: Obstetrics & Gynecology

## 2024-09-05 DIAGNOSIS — R109 Unspecified abdominal pain: Secondary | ICD-10-CM

## 2024-09-05 DIAGNOSIS — Z3A16 16 weeks gestation of pregnancy: Secondary | ICD-10-CM

## 2024-09-05 HISTORY — DX: Other specified health status: Z78.9

## 2024-09-05 LAB — URINALYSIS, ROUTINE W REFLEX MICROSCOPIC
Bilirubin Urine: NEGATIVE
Glucose, UA: NEGATIVE mg/dL
Hgb urine dipstick: NEGATIVE
Ketones, ur: NEGATIVE mg/dL
Leukocytes,Ua: NEGATIVE
Nitrite: NEGATIVE
Protein, ur: NEGATIVE mg/dL
Specific Gravity, Urine: 1.026 (ref 1.005–1.030)
pH: 7 (ref 5.0–8.0)

## 2024-09-05 LAB — WET PREP, GENITAL
Clue Cells Wet Prep HPF POC: NONE SEEN
Sperm: NONE SEEN
Trich, Wet Prep: NONE SEEN
WBC, Wet Prep HPF POC: <10
Yeast Wet Prep HPF POC: NONE SEEN

## 2024-09-05 LAB — PREGNANCY, URINE: Preg Test, Ur: POSITIVE — AB

## 2024-09-05 MED ORDER — ACETAMINOPHEN 500 MG PO TABS
1000.0000 mg | ORAL_TABLET | Freq: Once | ORAL | Status: AC
  Administered 2024-09-05: 1000 mg via ORAL
  Filled 2024-09-05: qty 2

## 2024-09-05 MED ORDER — CYCLOBENZAPRINE HCL 10 MG PO TABS
10.0000 mg | ORAL_TABLET | Freq: Once | ORAL | Status: AC
  Administered 2024-09-05: 10 mg via ORAL
  Filled 2024-09-05: qty 1

## 2024-09-05 NOTE — Plan of Care (Signed)
Advanced Directive handout given.

## 2024-09-05 NOTE — ED Triage Notes (Signed)
 Patient reports feeling increased cramping starting yesterday. She says that she has no bleeding or discharge. 17 weeks tomorrow. Sees OBGYN through [facility]. Next appointment with them is the 23rd.

## 2024-09-05 NOTE — ED Provider Notes (Addendum)
 " [place] EMERGENCY DEPARTMENT AT [facility] Provider Note   CSN: [id number] Arrival date & time: 09/05/24  1441     Patient presents with: Pregnancy Cramping  HPI Hannah Griffin  is a 24 y.o. female who reports she is [redacted] weeks pregnant presenting for abdominal cramping.  Started yesterday but the cramping seems to be more intense and frequent today.  Denies any abnormal vaginal bleeding or discharge.  Denies urinary or GI symptoms.  States she is in the [facility] system and has her neck appointment is on the 23rd of this month.  Denies recent sexual intercourse.   HPI     Prior to Admission medications  Medication Sig Start Date End Date Taking? Authorizing Provider  aspirin  EC 81 MG tablet Take 1 tablet (81 mg total) by mouth daily. Swallow whole. 07/23/24   Leftwich-Kirby, Olam LABOR, CNM  Prenatal Vit-Fe Fumarate-FA (PRENATAL VITAMIN) 27-0.8 MG TABS Take 1 tablet by mouth daily. 01/13/23   Dean Clarity, MD    Allergies: Patient has no known allergies.    Review of Systems See HPI  Updated Vital Signs BP 115/76   Pulse (!) 104   Temp 99.6 F (37.6 C)   Resp 20   LMP 05/10/2024   SpO2 98%   Physical Exam Vitals and nursing note reviewed.  HENT:     Head: Normocephalic and atraumatic.     Mouth/Throat:     Mouth: Mucous membranes are moist.  Eyes:     General:        Right eye: No discharge.        Left eye: No discharge.     Conjunctiva/sclera: Conjunctivae normal.  Cardiovascular:     Rate and Rhythm: Normal rate and regular rhythm.     Pulses: Normal pulses.     Heart sounds: Normal heart sounds.  Pulmonary:     Effort: Pulmonary effort is normal.     Breath sounds: Normal breath sounds.  Abdominal:     General: Abdomen is flat.     Palpations: Abdomen is soft.     Tenderness: There is no abdominal tenderness.     Comments: Fetal heart tones 152  Skin:    General: Skin is warm and dry.  Neurological:     General: No focal deficit present.   Psychiatric:        Mood and Affect: Mood normal.     (all labs ordered are listed, but only abnormal results are displayed) Labs Reviewed  PREGNANCY, URINE - Abnormal; Notable for the following components:      Result Value   Preg Test, Ur POSITIVE (*)    All other components within normal limits  WET PREP, GENITAL  URINALYSIS, ROUTINE W REFLEX MICROSCOPIC    EKG: None  Radiology: No results found.   Procedures   Medications Ordered in the ED - No data to display                                  Medical Decision Making Amount and/or Complexity of Data Reviewed Labs: ordered.  Risk Decision regarding hospitalization.   24 year old well-appearing female who reports that she [redacted] weeks pregnant presenting for abdominal cramping.  Exam here was unremarkable.  Per chart review, had an ultrasound on Dec 15th that showed IUP with estimated gestational age of [redacted] weeks and 3 days at that time.  Discussed patient with Dr. Ilean of OB/GYN who recommended  transfer via POV to [facility]U for further evaluation.  Also asked if we would gather a wet prep here.  Discussed plan with patient and she was agreeable.  Transferred by POV.  Remains well-appearing, stable and in no acute distress.     Final diagnoses:  Abdominal cramping    ED Discharge Orders     None          Lang Norleen MARLA DEVONNA 09/05/24 1654    Doretha Folks, MD 09/05/24 1911  "

## 2024-09-05 NOTE — MAU Note (Addendum)
 Hannah Griffin  is a 24 y.o. at [redacted]w[redacted]d here in [facility]U reporting: lower abd pain and cramping all day . Went to [facility] and then sent to [facility] for further eval . Denies any vag bleeding or discharge.   LMP:   Onset of complaint: today Pain score: 6  Vitals:   09/05/24 1451  BP: 115/76  Pulse: (!) 104  Resp: 20  Temp: 99.6 F (37.6 C)  SpO2: 98%     FHT: 154   Lab orders placed from triage:

## 2024-09-05 NOTE — MAU Provider Note (Signed)
 " History     CSN: [id number]  Arrival date and time: 09/05/24 1441   Event Date/Time   First Provider Initiated Contact with Patient 09/05/24 2114      Chief Complaint  Patient presents with   Pregnancy Cramping   Hannah Griffin  , a  24 y.o. G2P0010 at [redacted]w[redacted]d presents to MAU with complaints of lower abdominal cramping. Patient states that symptoms started last night, but became more consistent today. She reports cramping is consistent and currently rates as a 6/10. She denies attempting to relieve symptoms. She denies vaginal bleeding, leaking of fluid, abnormal vaginal discharge and urinary symptoms. Last intercourse was 2 weeks ago and currently endorses only 1-2 bottles of water in the last few days.   FOB is present and contributing to HPI.          OB History     Gravida  2   Para      Term      Preterm      AB  1   Living  0      SAB  1   IAB      Ectopic      Multiple      Live Births  0           Past Medical History:  Diagnosis Date   Medical history non-contributory     Past Surgical History:  Procedure Laterality Date   NO PAST SURGERIES      Family History  Problem Relation Age of Onset   Cancer Paternal Grandmother    Diabetes Paternal Grandfather    Cancer Maternal Grandparent     Social History[1]  Allergies: Allergies[2]  Medications Prior to Admission  Medication Sig Dispense Refill Last Dose/Taking   aspirin  EC 81 MG tablet Take 1 tablet (81 mg total) by mouth daily. Swallow whole. 30 tablet 5    Prenatal Vit-Fe Fumarate-FA (PRENATAL VITAMIN) 27-0.8 MG TABS Take 1 tablet by mouth daily. 100 tablet 0     Review of Systems  Constitutional:  Negative for chills, fatigue and fever.  Eyes:  Negative for pain and visual disturbance.  Respiratory:  Negative for apnea, shortness of breath and wheezing.   Cardiovascular:  Negative for chest pain and palpitations.  Gastrointestinal:  Positive for abdominal pain. Negative  for constipation, diarrhea, nausea and vomiting.  Genitourinary:  Negative for difficulty urinating, dysuria, pelvic pain, vaginal bleeding, vaginal discharge and vaginal pain.  Musculoskeletal:  Negative for back pain.  Neurological:  Negative for seizures, weakness and headaches.  Psychiatric/Behavioral:  Negative for suicidal ideas.    Physical Exam   Blood pressure 117/65, pulse 82, temperature 98.8 F (37.1 C), resp. rate 18, height 5' 1 (1.549 m), weight 74.8 kg, last menstrual period 05/10/2024, SpO2 98%.  Physical Exam Vitals and nursing note reviewed.  Constitutional:      General: She is not in acute distress.    Appearance: Normal appearance.  HENT:     Head: Normocephalic.  Pulmonary:     Effort: Pulmonary effort is normal.  Abdominal:     Palpations: Abdomen is soft.     Tenderness: There is no abdominal tenderness.  Musculoskeletal:     Cervical back: Normal range of motion.  Skin:    General: Skin is warm and dry.  Neurological:     Mental Status: She is alert and oriented to person, place, and time.  Psychiatric:        Mood and Affect: Mood normal.  FHT obtained in triage   MAU Course  Procedures Orders Placed This Encounter  Procedures   Wet prep, genital   Urinalysis, Routine w reflex microscopic -Urine, Clean Catch   Pregnancy, urine   Diet NPO time specified   Pelvic cart to bedside when patient is in treatment room.   Encourage/Reinforce Importance Po Fluids   Meds ordered this encounter  Medications   acetaminophen  (TYLENOL ) tablet 1,000 mg   cyclobenzaprine  (FLEXERIL ) tablet 10 mg    MDM - UA and Wet prep normal, lowering suspicion for infection causing pelciv pain.  - PO pain mangement and PO hydration recommended.  - Pain improved from 6/10 to 3 with PO meds - plan for discharge.   Assessment and Plan   1. Abdominal cramping   2. [redacted] weeks gestation of pregnancy    - Reviewed cramping as a normal discomfort of pregnancy.  -  Reviewed worsening signs and return precautions. Discussed comfort measures and OTC options for pelvic pain.  - Patient discharged home in stable condition and may return to MAU as needed.   [name], MSN CNM  09/05/2024, 9:14 PM      [1]  Social History Tobacco Use   Smoking status: Never   Smokeless tobacco: Never  Vaping Use   Vaping status: Never Used  Substance Use Topics   Alcohol use: No   Drug use: No  [2] No Known Allergies  "

## 2024-09-05 NOTE — Progress Notes (Signed)
 Claris Cedar CNM in earlier to discuss d/c plan. Written and verbal d/c instructions given and pt voiced understanding

## 2024-09-05 NOTE — ED Notes (Signed)
 Patient to be transfer to MAU at [facility] by POV.  Significant other driving.  Instructions given on where to check in and know to go straight to MAU

## 2024-09-05 NOTE — ED Triage Notes (Signed)
 Fetal heart tones 152 in triage

## 2024-09-18 ENCOUNTER — Encounter: Admitting: Obstetrics & Gynecology

## 2024-09-23 ENCOUNTER — Encounter: Admitting: Obstetrics and Gynecology

## 2024-09-30 ENCOUNTER — Other Ambulatory Visit
